# Patient Record
Sex: Female | Born: 1975 | Race: White | Hispanic: No | Marital: Married | Smoking: Never smoker
Health system: Southern US, Community
[De-identification: ages and names within clinical notes are randomized; demographics above are authoritative.]

## PROBLEM LIST (undated history)

## (undated) HISTORY — PX: BREAST LUMPECTOMY: SHX2

---

## 2013-03-23 ENCOUNTER — Inpatient Hospital Stay: Payer: Self-pay | Admitting: Obstetrics & Gynecology

## 2013-03-23 LAB — CBC WITH DIFFERENTIAL/PLATELET
Basophil #: 0.1 10*3/uL (ref 0.0–0.1)
Basophil %: 0.3 %
HGB: 12.4 g/dL (ref 12.0–16.0)
Lymphocyte %: 6.5 %
MCHC: 33.6 g/dL (ref 32.0–36.0)
MCV: 94 fL (ref 80–100)
Monocyte %: 4.4 %
RDW: 13.7 % (ref 11.5–14.5)

## 2013-03-24 LAB — HEMATOCRIT: HCT: 26.1 % — ABNORMAL LOW (ref 35.0–47.0)

## 2013-03-25 LAB — CBC WITH DIFFERENTIAL/PLATELET
Basophil #: 0 10*3/uL (ref 0.0–0.1)
Eosinophil #: 0.2 10*3/uL (ref 0.0–0.7)
Eosinophil %: 1.4 %
Lymphocyte #: 1.8 10*3/uL (ref 1.0–3.6)
Lymphocyte %: 13.6 %
MCHC: 33.6 g/dL (ref 32.0–36.0)
MCV: 94 fL (ref 80–100)
Monocyte %: 7 %
Neutrophil #: 10.2 10*3/uL — ABNORMAL HIGH (ref 1.4–6.5)
Neutrophil %: 77.7 %
Platelet: 216 10*3/uL (ref 150–440)
RBC: 2.7 10*6/uL — ABNORMAL LOW (ref 3.80–5.20)
RDW: 14.4 % (ref 11.5–14.5)
WBC: 13.1 10*3/uL — ABNORMAL HIGH (ref 3.6–11.0)

## 2013-05-18 ENCOUNTER — Ambulatory Visit: Payer: Self-pay | Admitting: Pediatrics

## 2013-05-29 ENCOUNTER — Ambulatory Visit: Payer: Self-pay | Admitting: Pediatrics

## 2015-04-05 NOTE — Op Note (Signed)
PATIENT NAME:  Kristin Black, Kristin Black MR#:  [id number] DATE OF BIRTH:  1976/04/24  DATE OF PROCEDURE:  03/23/2013  PREOPERATIVE DIAGNOSIS: Fetal intolerance to labor.   POSTOPERATIVE DIAGNOSIS: Fetal intolerance to labor.  PROCEDURE:  Low transverse cesarean section.   SURGEON: Senaida LangeLashawn Weaver Lee, M.D.   ASSISTANDierdre Searles: R. Paul Harris, MD   ANESTHESIA:  Epidural.  ESTIMATED BLOOD LOSS: 500 mL.  OPERATIVE FLUIDS: 1 liter.   COMPLICATIONS: None.   FINDINGS: Vertex female infant, weight 2940 grams, Apgars 9 and 9, normal uterus, tubes, and ovaries.   SPECIMEN TO PATHOLOGY: Placenta.   INDICATIONS: The patient is a 39 year old G1 who presents for labor.  However, during the course of her labor the baby had some decelerations in its fetal heart rate and therefore it was decided to proceed towards cesarean section for delivery. The risks, benefits, indications and alternatives of the procedure were explained and informed consent was obtained.   DESCRIPTION OF PROCEDURE: The patient was taken to the operating room with IV fluids running. She was prepped and draped in the usual sterile fashion with a leftward tilt. A Pfannenstiel skin incision was made, carried down to underlying fascia with a knife. The fascia was nicked in the midline. The incision was extended laterally. The superior aspect of the fascia was grasped with Kochers and the underlying rectus muscles were dissected off. This was repeated on the inferior fascia. The rectus muscles were divided in the midline, the peritoneum was entered bluntly and the opening was extended. The bladder blade was placed. The vesicouterine peritoneum was grasped with pick-ups and entered sharply with the Metzenbaum. Bladder flap was created digitally. The bladder blade was replaced. The hysterotomy incision was made and carried down to underlying fetal parts. The opening was extended. The infant's head was grasped and delivered atraumatically through the hysterotomy  incision. A nuchal cord x 1 was reduced. The mouth and nose were bulb suctioned. The anterior and posterior shoulder were delivered followed by the remainder of the body and the baby was handed to the awaiting neonatologist. The cord was clamped x 2 and cut, after collection of cord blood. The placenta was expressed. The uterus was exteriorized and cleared of all clot and debris. The hysterotomy incision was repaired with a #0 Monocryl in a running locked fashion. The uterus was returned to the abdomen. The abdomen and gutters were irrigated with copious amounts of warm normal saline. The peritoneum was closed with #2-0 Vicryl. The On-Q pump apparatus was placed, according to manufacturer's instructions. The fascia was closed with a #1 PDS. The skin was closed with #4-0 Vicryl. The catheters of the On-Q pump was bolused with 5 mL of 0.5% Sensorcaine plain each.  The catheters were secured to the abdomen using Steri-Strips and Tegaderm. The patient tolerated the procedure well. Sponge, lap, needle and instrument counts were correct x 2. The patient was awakened and taken to the recovery room in stable condition. ____________________________ Sonda PrimesLashawn A. Patton SallesWeaver-Lee, MD law:sb D: 04/04/2013 08:57:40 ET T: 04/04/2013 09:19:48 ET JOB#: [id number]  cc: Flint MelterLashawn A. Patton SallesWeaver-Lee, MD, <Dictator> Sonda PrimesLASHAWN A WEAVER LEE MD ELECTRONICALLY SIGNED 04/07/2013 17:12

## 2015-04-05 NOTE — Consult Note (Signed)
Details:   - Assesment: mother and infant canme in for a consult today. Mother is complaining of sore nipples and has been treated for yeast with Diflucan.Her pain now is described as feeling like a bruise and irritation on the nipple. She reports that any clothing on the nipple makes it feel tender.  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 In addition the baby ha had poor weight gain. Plan: Observed the feeding oobtaining a pre and post weight. Baby is using chomping motions to maintain the lips in a seal to geet milk. The post weight at 25 minutes was 22 ml.  I have reccommended that she refer back to her baby's pediatrician to reccommend treatment of the tongue tie.  In addition I told her to supplement the breast feeding with 1 to 2 ounces of her pumped milk. She may also pump if breatdfeeding is to painful.   Electronic Signatures: Lyla GlassingJuettner, Carolyn (RN)  (Signed 05-Jun-14 16:57)  Authored: Details   Last Updated: 05-Jun-14 16:57 by Lyla GlassingJuettner, Carolyn (RN)

## 2019-09-05 ENCOUNTER — Other Ambulatory Visit: Payer: Self-pay | Admitting: *Deleted

## 2019-09-05 DIAGNOSIS — Z20822 Contact with and (suspected) exposure to covid-19: Secondary | ICD-10-CM

## 2019-09-06 LAB — NOVEL CORONAVIRUS, NAA: SARS-CoV-2, NAA: NOT DETECTED

## 2019-10-04 ENCOUNTER — Other Ambulatory Visit: Payer: Self-pay

## 2019-10-04 ENCOUNTER — Ambulatory Visit: Payer: Self-pay

## 2019-10-04 DIAGNOSIS — Z23 Encounter for immunization: Secondary | ICD-10-CM

## 2019-12-11 ENCOUNTER — Ambulatory Visit: Payer: BC Managed Care – PPO | Attending: Internal Medicine

## 2019-12-11 DIAGNOSIS — Z20822 Contact with and (suspected) exposure to covid-19: Secondary | ICD-10-CM

## 2019-12-13 LAB — NOVEL CORONAVIRUS, NAA: SARS-CoV-2, NAA: NOT DETECTED

## 2021-10-08 ENCOUNTER — Other Ambulatory Visit: Payer: Self-pay

## 2021-10-08 ENCOUNTER — Ambulatory Visit: Payer: Self-pay

## 2021-10-08 DIAGNOSIS — Z23 Encounter for immunization: Secondary | ICD-10-CM

## 2022-05-01 ENCOUNTER — Other Ambulatory Visit: Payer: Self-pay | Admitting: Obstetrics & Gynecology

## 2022-05-01 DIAGNOSIS — Z1231 Encounter for screening mammogram for malignant neoplasm of breast: Secondary | ICD-10-CM

## 2022-05-29 ENCOUNTER — Ambulatory Visit
Admission: RE | Admit: 2022-05-29 | Discharge: 2022-05-29 | Disposition: A | Discharge status: Home / Self Care | Payer: BC Managed Care – PPO | Source: Ambulatory Visit | Attending: Obstetrics & Gynecology | Admitting: Obstetrics & Gynecology

## 2022-05-29 DIAGNOSIS — Z1231 Encounter for screening mammogram for malignant neoplasm of breast: Secondary | ICD-10-CM | POA: Insufficient documentation

## 2022-06-02 ENCOUNTER — Inpatient Hospital Stay
Admission: RE | Admit: 2022-06-02 | Discharge: 2022-06-02 | Disposition: A | Discharge status: Home / Self Care | Payer: Self-pay | Source: Ambulatory Visit | Attending: *Deleted | Admitting: *Deleted

## 2022-06-02 ENCOUNTER — Other Ambulatory Visit: Payer: Self-pay | Admitting: *Deleted

## 2022-06-02 DIAGNOSIS — Z1231 Encounter for screening mammogram for malignant neoplasm of breast: Secondary | ICD-10-CM

## 2023-06-07 ENCOUNTER — Other Ambulatory Visit: Payer: Self-pay | Admitting: Obstetrics & Gynecology

## 2023-06-07 DIAGNOSIS — Z1231 Encounter for screening mammogram for malignant neoplasm of breast: Secondary | ICD-10-CM

## 2023-07-07 ENCOUNTER — Ambulatory Visit
Admission: RE | Admit: 2023-07-07 | Discharge: 2023-07-07 | Disposition: A | Discharge status: Home / Self Care | Payer: BC Managed Care – PPO | Source: Ambulatory Visit | Attending: Obstetrics & Gynecology | Admitting: Obstetrics & Gynecology

## 2023-07-07 DIAGNOSIS — Z1231 Encounter for screening mammogram for malignant neoplasm of breast: Secondary | ICD-10-CM | POA: Diagnosis present

## 2023-07-15 ENCOUNTER — Other Ambulatory Visit: Payer: Self-pay | Admitting: Obstetrics & Gynecology

## 2023-07-15 DIAGNOSIS — R928 Other abnormal and inconclusive findings on diagnostic imaging of breast: Secondary | ICD-10-CM

## 2023-07-15 DIAGNOSIS — C801 Malignant (primary) neoplasm, unspecified: Secondary | ICD-10-CM

## 2023-07-15 HISTORY — DX: Malignant (primary) neoplasm, unspecified: C80.1

## 2023-07-16 ENCOUNTER — Other Ambulatory Visit: Payer: Self-pay | Admitting: Obstetrics & Gynecology

## 2023-07-16 ENCOUNTER — Encounter: Payer: Self-pay | Admitting: Obstetrics & Gynecology

## 2023-07-16 ENCOUNTER — Ambulatory Visit
Admission: RE | Admit: 2023-07-16 | Discharge: 2023-07-16 | Disposition: A | Discharge status: Home / Self Care | Payer: BC Managed Care – PPO | Source: Ambulatory Visit | Attending: Obstetrics & Gynecology | Admitting: Obstetrics & Gynecology

## 2023-07-16 DIAGNOSIS — R928 Other abnormal and inconclusive findings on diagnostic imaging of breast: Secondary | ICD-10-CM

## 2023-07-16 DIAGNOSIS — R921 Mammographic calcification found on diagnostic imaging of breast: Secondary | ICD-10-CM

## 2023-07-28 ENCOUNTER — Ambulatory Visit
Admission: RE | Admit: 2023-07-28 | Discharge: 2023-07-28 | Disposition: A | Discharge status: Home / Self Care | Payer: BC Managed Care – PPO | Source: Ambulatory Visit | Attending: Obstetrics & Gynecology | Admitting: Obstetrics & Gynecology

## 2023-07-28 DIAGNOSIS — R928 Other abnormal and inconclusive findings on diagnostic imaging of breast: Secondary | ICD-10-CM | POA: Insufficient documentation

## 2023-07-28 DIAGNOSIS — R921 Mammographic calcification found on diagnostic imaging of breast: Secondary | ICD-10-CM | POA: Insufficient documentation

## 2023-07-28 HISTORY — PX: BREAST BIOPSY: SHX20

## 2023-07-28 MED ORDER — LIDOCAINE HCL 1 % IJ SOLN
10.0000 mL | Freq: Once | INTRAMUSCULAR | Status: AC
  Administered 2023-07-28: 10 mL
  Filled 2023-07-28: qty 10

## 2023-07-28 MED ORDER — LIDOCAINE 1 % OPTIME INJ - NO CHARGE
5.0000 mL | Freq: Once | INTRAMUSCULAR | Status: AC
  Administered 2023-07-28: 5 mL
  Filled 2023-07-28: qty 6

## 2023-07-28 MED ORDER — LIDOCAINE-EPINEPHRINE 1 %-1:100000 IJ SOLN
10.0000 mL | Freq: Once | INTRAMUSCULAR | Status: AC
  Administered 2023-07-28: 10 mL
  Filled 2023-07-28: qty 10

## 2023-07-29 ENCOUNTER — Encounter: Payer: Self-pay | Admitting: *Deleted

## 2023-07-29 NOTE — Progress Notes (Signed)
Received referral for newly diagnosed breast cancer from [facility].  Navigation initiated.  She would like to discuss her results with her GYN and get her input on who she recommends for surgeon/med onc.   I will call her on Monday to see what her decision is.

## 2023-08-02 ENCOUNTER — Encounter: Payer: Self-pay | Admitting: *Deleted

## 2023-08-02 NOTE — Progress Notes (Signed)
Ms. Hritz has been referred to surgeon Dr. Cherlyn Cushing by her gyn.  No further needs at this time.

## 2023-12-30 ENCOUNTER — Encounter: Payer: Self-pay | Admitting: Gastroenterology

## 2024-01-03 ENCOUNTER — Encounter: Payer: Self-pay | Admitting: Gastroenterology

## 2024-01-13 ENCOUNTER — Ambulatory Visit (INDEPENDENT_AMBULATORY_CARE_PROVIDER_SITE_OTHER): Payer: Self-pay

## 2024-01-13 DIAGNOSIS — Z23 Encounter for immunization: Secondary | ICD-10-CM

## 2024-02-29 ENCOUNTER — Encounter: Payer: Self-pay | Admitting: Gastroenterology

## 2024-03-06 ENCOUNTER — Encounter: Payer: Self-pay | Admitting: Gastroenterology

## 2024-03-10 ENCOUNTER — Encounter: Payer: Self-pay | Admitting: Gastroenterology

## 2024-03-13 ENCOUNTER — Encounter
Admission: RE | Disposition: A | Discharge status: Home / Self Care | Payer: Self-pay | Source: Home / Self Care | Attending: Gastroenterology

## 2024-03-13 ENCOUNTER — Ambulatory Visit: Admitting: Anesthesiology

## 2024-03-13 ENCOUNTER — Other Ambulatory Visit: Payer: Self-pay | Admitting: Gastroenterology

## 2024-03-13 ENCOUNTER — Ambulatory Visit
Admission: RE | Admit: 2024-03-13 | Discharge: 2024-03-13 | Disposition: A | Discharge status: Home / Self Care | Payer: BC Managed Care – PPO | Attending: Gastroenterology | Admitting: Gastroenterology

## 2024-03-13 ENCOUNTER — Encounter: Payer: Self-pay | Admitting: Gastroenterology

## 2024-03-13 DIAGNOSIS — K2970 Gastritis, unspecified, without bleeding: Secondary | ICD-10-CM | POA: Insufficient documentation

## 2024-03-13 DIAGNOSIS — Z8371 Family history of adenomatous and serrated polyps: Secondary | ICD-10-CM | POA: Diagnosis not present

## 2024-03-13 DIAGNOSIS — D122 Benign neoplasm of ascending colon: Secondary | ICD-10-CM | POA: Insufficient documentation

## 2024-03-13 DIAGNOSIS — Z800 Family history of malignant neoplasm of digestive organs: Secondary | ICD-10-CM | POA: Insufficient documentation

## 2024-03-13 DIAGNOSIS — Z923 Personal history of irradiation: Secondary | ICD-10-CM | POA: Diagnosis not present

## 2024-03-13 DIAGNOSIS — Z9221 Personal history of antineoplastic chemotherapy: Secondary | ICD-10-CM | POA: Insufficient documentation

## 2024-03-13 DIAGNOSIS — Z1211 Encounter for screening for malignant neoplasm of colon: Secondary | ICD-10-CM | POA: Diagnosis present

## 2024-03-13 DIAGNOSIS — Z853 Personal history of malignant neoplasm of breast: Secondary | ICD-10-CM | POA: Insufficient documentation

## 2024-03-13 DIAGNOSIS — K621 Rectal polyp: Secondary | ICD-10-CM | POA: Insufficient documentation

## 2024-03-13 HISTORY — PX: POLYPECTOMY: SHX5525

## 2024-03-13 HISTORY — PX: COLONOSCOPY WITH PROPOFOL: SHX5780

## 2024-03-13 HISTORY — PX: ESOPHAGOGASTRODUODENOSCOPY (EGD) WITH PROPOFOL: SHX5813

## 2024-03-13 LAB — POCT PREGNANCY, URINE: Preg Test, Ur: NEGATIVE

## 2024-03-13 SURGERY — COLONOSCOPY WITH PROPOFOL
Anesthesia: General

## 2024-03-13 MED ORDER — PROPOFOL 500 MG/50ML IV EMUL
INTRAVENOUS | Status: DC | PRN
  Administered 2024-03-13: 160 ug/kg/min via INTRAVENOUS

## 2024-03-13 MED ORDER — LIDOCAINE HCL (PF) 2 % IJ SOLN
INTRAMUSCULAR | Status: AC
  Filled 2024-03-13: qty 5

## 2024-03-13 MED ORDER — SODIUM CHLORIDE 0.9 % IV SOLN
INTRAVENOUS | Status: DC
  Administered 2024-03-13: 1000 mL via INTRAVENOUS

## 2024-03-13 MED ORDER — PROPOFOL 10 MG/ML IV BOLUS
INTRAVENOUS | Status: DC | PRN
  Administered 2024-03-13: 50 mg via INTRAVENOUS

## 2024-03-13 MED ORDER — LIDOCAINE HCL (CARDIAC) PF 100 MG/5ML IV SOSY
PREFILLED_SYRINGE | INTRAVENOUS | Status: DC | PRN
  Administered 2024-03-13: 50 mg via INTRAVENOUS

## 2024-03-13 MED ORDER — SODIUM CHLORIDE 0.9 % IV SOLN: INTRAVENOUS | Status: DC | PRN

## 2024-03-13 MED ORDER — DEXMEDETOMIDINE HCL IN NACL 80 MCG/20ML IV SOLN
INTRAVENOUS | Status: AC
  Filled 2024-03-13: qty 20

## 2024-03-13 MED ORDER — GLYCOPYRROLATE 0.2 MG/ML IJ SOLN
INTRAMUSCULAR | Status: AC
Start: 2024-03-13 — End: ?
  Filled 2024-03-13: qty 1

## 2024-03-13 MED ORDER — PROPOFOL 1000 MG/100ML IV EMUL
INTRAVENOUS | Status: AC
  Filled 2024-03-13: qty 100

## 2024-03-13 MED ORDER — GLYCOPYRROLATE 0.2 MG/ML IJ SOLN
INTRAMUSCULAR | Status: DC | PRN
  Administered 2024-03-13: .2 mg via INTRAVENOUS

## 2024-03-13 NOTE — Transfer of Care (Signed)
 Immediate Anesthesia Transfer of Care Note  Patient: Kristin Black  Procedure(s) Performed: COLONOSCOPY WITH PROPOFOL ESOPHAGOGASTRODUODENOSCOPY (EGD) WITH PROPOFOL  Patient Location: PACU  Anesthesia Type:General  Level of Consciousness: sedated  Airway & Oxygen Therapy: Patient Spontanous Breathing  Post-op Assessment: Report given to RN and Post -op Vital signs reviewed and stable  Post vital signs: Reviewed and stable  Last Vitals:  Vitals Value Taken Time  BP 100/61 03/13/24 0814  Temp 36.1 C 03/13/24 0813  Pulse 66 03/13/24 0814  Resp 19 03/13/24 0814  SpO2 100 % 03/13/24 0814  Vitals shown include unfiled device data.  Last Pain:  Vitals:   03/13/24 0813  TempSrc: Temporal  PainSc: 0-No pain         Complications: No notable events documented.

## 2024-03-13 NOTE — Anesthesia Preprocedure Evaluation (Signed)
 Anesthesia Evaluation  Patient identified by MRN, date of birth, ID band Patient awake    Reviewed: Allergy & Precautions, NPO status , Patient's Chart, lab work & pertinent test results  History of Anesthesia Complications Negative for: history of anesthetic complications  Airway Mallampati: III  TM Distance: <3 FB Neck ROM: full    Dental  (+) Chipped   Pulmonary neg pulmonary ROS, neg shortness of breath   Pulmonary exam normal        Cardiovascular Exercise Tolerance: Good (-) angina negative cardio ROS Normal cardiovascular exam     Neuro/Psych negative neurological ROS  negative psych ROS   GI/Hepatic negative GI ROS, Neg liver ROS,neg GERD  ,,  Endo/Other  negative endocrine ROS    Renal/GU negative Renal ROS  negative genitourinary   Musculoskeletal   Abdominal   Peds  Hematology negative hematology ROS (+)   Anesthesia Other Findings Past Medical History: 07/2023: Cancer [facility])  Past Surgical History: 07/28/2023: BREAST BIOPSY; Left     Comment:  affirm bx, x marker, path pending 07/28/2023: BREAST BIOPSY; Left     Comment:  MM LT BREAST BX W LOC DEV 1ST LESION IMAGE BX SPEC               STEREO GUIDE 07/28/2023 [facility] No date: BREAST LUMPECTOMY; Left     Reproductive/Obstetrics negative OB ROS                             Anesthesia Physical Anesthesia Plan  ASA: 2  Anesthesia Plan: General   Post-op Pain Management:    Induction: Intravenous  PONV Risk Score and Plan: Propofol infusion and TIVA  Airway Management Planned: Natural Airway and Nasal Cannula  Additional Equipment:   Intra-op Plan:   Post-operative Plan:   Informed Consent: I have reviewed the patients History and Physical, chart, labs and discussed the procedure including the risks, benefits and alternatives for the proposed anesthesia with the patient or authorized representative  who has indicated his/her understanding and acceptance.     Dental Advisory Given  Plan Discussed with: Anesthesiologist, CRNA and Surgeon  Anesthesia Plan Comments: (Patient consented for risks of anesthesia including but not limited to:  - adverse reactions to medications - risk of airway placement if required - damage to eyes, teeth, lips or other oral mucosa - nerve damage due to positioning  - sore throat or hoarseness - Damage to heart, brain, nerves, lungs, other parts of body or loss of life  Patient voiced understanding and assent.)       Anesthesia Quick Evaluation

## 2024-03-13 NOTE — Interval H&P Note (Signed)
 History and Physical Interval Note: Preprocedure H&P from 03/13/24  was reviewed and there was no interval change after seeing and examining the patient.  Written consent was obtained from the patient after discussion of risks, benefits, and alternatives. Patient has consented to proceed with Esophagogastroduodenoscopy and Colonoscopy with possible intervention   03/13/2024 7:19 AM  Kristin Black  has presented today for surgery, with the diagnosis of D50.9 (ICD-10-CM) - Iron deficiency anemia, unspecified iron deficiency anemia type.  The various methods of treatment have been discussed with the patient and family. After consideration of risks, benefits and other options for treatment, the patient has consented to  Procedure(s): COLONOSCOPY WITH PROPOFOL (N/A) ESOPHAGOGASTRODUODENOSCOPY (EGD) WITH PROPOFOL (N/A) as a surgical intervention.  The patient's history has been reviewed, patient examined, no change in status, stable for surgery.  I have reviewed the patient's chart and labs.  Questions were answered to the patient's satisfaction.     Jaynie Collins

## 2024-03-13 NOTE — Op Note (Addendum)
 [facility] Patient Name: Kristin Black Procedure Date: 03/13/2024 7:36 AM MRN: [id number] Account #: [id number] Date of Birth: [birth year] Admit Type: Outpatient Age: 48 Room: [facility] ENDO ROOM 2 Gender: Female Note Status: Supervisor Override Instrument Name: Peds Colonoscope [id number] Procedure:             Colonoscopy Indications:           Colon cancer screening in patient with 1st-degree                         relative having advanced adenoma of the colon before                         age 45, Iron deficiency anemia Providers:             Trenda Moots, DO Referring MD:          Jaynie Collins DO, DO (Referring MD), No Local                         Md, MD (Referring MD) Medicines:             Monitored Anesthesia Care Complications:         No immediate complications. Estimated blood loss:                         Minimal. Procedure:             Pre-Anesthesia Assessment:                        - Prior to the procedure, a History and Physical was                         performed, and patient medications and allergies were                         reviewed. The patient is competent. The risks and                         benefits of the procedure and the sedation options and                         risks were discussed with the patient. All questions                         were answered and informed consent was obtained.                         Patient identification and proposed procedure were                         verified by the physician, the nurse, the anesthetist                         and the technician in the endoscopy suite. Mental                         Status Examination: alert and oriented. Airway  Examination: normal oropharyngeal airway and neck                         mobility. Respiratory Examination: clear to                         auscultation. CV Examination: RRR, no murmurs, no S3                          or S4. Prophylactic Antibiotics: The patient does not                         require prophylactic antibiotics. Prior                         Anticoagulants: The patient has taken no anticoagulant                         or antiplatelet agents. ASA Grade Assessment: II - A                         patient with mild systemic disease. After reviewing                         the risks and benefits, the patient was deemed in                         satisfactory condition to undergo the procedure. The                         anesthesia plan was to use monitored anesthesia care                         (MAC). Immediately prior to administration of                         medications, the patient was re-assessed for adequacy                         to receive sedatives. The heart rate, respiratory                         rate, oxygen saturations, blood pressure, adequacy of                         pulmonary ventilation, and response to care were                         monitored throughout the procedure. The physical                         status of the patient was re-assessed after the                         procedure.                        After obtaining informed consent, the colonoscope was  passed under direct vision. Throughout the procedure,                         the patient's blood pressure, pulse, and oxygen                         saturations were monitored continuously. The                         Colonoscope was introduced through the anus and                         advanced to the the terminal ileum, with                         identification of the appendiceal orifice and IC                         valve. The colonoscopy was performed without                         difficulty. The patient tolerated the procedure well.                         The quality of the bowel preparation was evaluated                         using the BBPS  [facility] Bowel Preparation Scale) with                         scores of: Right Colon = 3, Transverse Colon = 3 and                         Left Colon = 3 (entire mucosa seen well with no                         residual staining, small fragments of stool or opaque                         liquid). The total BBPS score equals 9. The terminal                         ileum, ileocecal valve, appendiceal orifice, and                         rectum were photographed. Findings:      The perianal and digital rectal examinations were normal. Pertinent       negatives include normal sphincter tone.      The terminal ileum appeared normal. Estimated blood loss: none.      Retroflexion in the right colon was performed.      Two sessile polyps were found in the rectum and ascending colon. The       polyps were 1 to 3 mm in size. These polyps were removed with a jumbo       cold forceps. Resection and retrieval were complete. Estimated blood       loss was minimal.      The exam was otherwise  without abnormality on direct and retroflexion       views. Impression:            - The examined portion of the ileum was normal.                        - Two 1 to 3 mm polyps in the rectum and in the                         ascending colon, removed with a jumbo cold forceps.                         Resected and retrieved.                        - The examination was otherwise normal on direct and                         retroflexion views. Recommendation:        - Patient has a contact number available for                         emergencies. The signs and symptoms of potential                         delayed complications were discussed with the patient.                         Return to normal activities tomorrow. Written                         discharge instructions were provided to the patient.                        - Discharge patient to home.                        - Resume previous diet.                         - Continue present medications.                        - Await pathology results.                        - Repeat colonoscopy in 5 years for surveillance based                         on pathology results.                        - Return to GI office as previously scheduled.                        - The findings and recommendations were discussed with                         the patient. Procedure Code(s):     --- Professional ---  60454, Colonoscopy, flexible; with biopsy, single or                         multiple Diagnosis Code(s):     --- Professional ---                        Z83.71, Family history of colonic polyps                        D12.8, Benign neoplasm of rectum                        D12.2, Benign neoplasm of ascending colon CPT copyright 2022 American Medical Association. All rights reserved. The codes documented in this report are preliminary and upon coder review may  be revised to meet current compliance requirements. Attending Participation:      I personally performed the entire procedure. Elfredia Nevins, DO Jaynie Collins DO, DO 03/13/2024 8:13:27 AM This report has been signed electronically. Number of Addenda: 0 Note Initiated On: 03/13/2024 7:36 AM Scope Withdrawal Time: 0 hours 9 minutes 59 seconds  Total Procedure Duration: 0 hours 15 minutes 32 seconds  Estimated Blood Loss:  Estimated blood loss was minimal.      [facility]

## 2024-03-13 NOTE — Op Note (Addendum)
 [facility] Gastroenterology Patient Name: Kristin Black Procedure Date: 03/13/2024 7:37 AM MRN: [id number] Account #: [id number] Date of Birth: [birth year] Admit Type: Outpatient Age: 48 Room: [facility] ENDO ROOM 2 Gender: Female Note Status: Finalized Instrument Name: Laurette Schimke [id number] Procedure:             Upper GI endoscopy Indications:           Iron deficiency anemia Providers:             Trenda Moots, DO Referring MD:          Jaynie Collins DO, DO (Referring MD), No Local                         Md, MD (Referring MD) Medicines:             Monitored Anesthesia Care Complications:         No immediate complications. Estimated blood loss:                         Minimal. Procedure:             Pre-Anesthesia Assessment:                        - Prior to the procedure, a History and Physical was                         performed, and patient medications and allergies were                         reviewed. The patient is competent. The risks and                         benefits of the procedure and the sedation options and                         risks were discussed with the patient. All questions                         were answered and informed consent was obtained.                         Patient identification and proposed procedure were                         verified by the physician, the nurse, the anesthetist                         and the technician in the endoscopy suite. Mental                         Status Examination: alert and oriented. Airway                         Examination: normal oropharyngeal airway and neck                         mobility. Respiratory Examination: clear to  auscultation. CV Examination: RRR, no murmurs, no S3                         or S4. Prophylactic Antibiotics: The patient does not                         require prophylactic antibiotics. Prior                          Anticoagulants: The patient has taken no anticoagulant                         or antiplatelet agents. ASA Grade Assessment: II - A                         patient with mild systemic disease. After reviewing                         the risks and benefits, the patient was deemed in                         satisfactory condition to undergo the procedure. The                         anesthesia plan was to use monitored anesthesia care                         (MAC). Immediately prior to administration of                         medications, the patient was re-assessed for adequacy                         to receive sedatives. The heart rate, respiratory                         rate, oxygen saturations, blood pressure, adequacy of                         pulmonary ventilation, and response to care were                         monitored throughout the procedure. The physical                         status of the patient was re-assessed after the                         procedure.                        After obtaining informed consent, the endoscope was                         passed under direct vision. Throughout the procedure,                         the patient's blood pressure, pulse, and oxygen  saturations were monitored continuously. The Endoscope                         was introduced through the mouth, and advanced to the                         second part of duodenum. The upper GI endoscopy was                         accomplished without difficulty. The patient tolerated                         the procedure well. Findings:      The duodenal bulb, first portion of the duodenum and second portion of       the duodenum were normal. Biopsies for histology were taken with a cold       forceps for evaluation of celiac disease. Estimated blood loss was       minimal.      Localized mild inflammation characterized by erosions was found in the       gastric antrum.  Biopsies were taken with a cold forceps for Helicobacter       pylori testing. Estimated blood loss was minimal.      The exam of the stomach was otherwise normal.      The Z-line was regular. Estimated blood loss: none.      Esophagogastric landmarks were identified: the gastroesophageal junction       was found at 40 cm from the incisors.      The exam of the esophagus was otherwise normal. Impression:            - Normal duodenal bulb, first portion of the duodenum                         and second portion of the duodenum. Biopsied.                        - Gastritis. Biopsied.                        - Z-line regular.                        - Esophagogastric landmarks identified. Recommendation:        - Patient has a contact number available for                         emergencies. The signs and symptoms of potential                         delayed complications were discussed with the patient.                         Return to normal activities tomorrow. Written                         discharge instructions were provided to the patient.                        - Resume previous diet.                        -  Continue present medications.                        - Await pathology results.                        - Return to GI clinic as previously scheduled.                        - proceed with colonoscopy. see report for further                         recommendations.                        - The findings and recommendations were discussed with                         the patient. Procedure Code(s):     --- Professional ---                        [phone], Esophagogastroduodenoscopy, flexible,                         transoral; with biopsy, single or multiple Diagnosis Code(s):     --- Professional ---                        K29.70, Gastritis, unspecified, without bleeding                        D50.9, Iron deficiency anemia, unspecified CPT copyright 2022 American Medical  Association. All rights reserved. The codes documented in this report are preliminary and upon coder review may  be revised to meet current compliance requirements. Attending Participation:      I personally performed the entire procedure. Elfredia Nevins, DO Jaynie Collins DO, DO 03/13/2024 7:50:54 AM This report has been signed electronically. Number of Addenda: 0 Note Initiated On: 03/13/2024 7:37 AM Estimated Blood Loss:  Estimated blood loss was minimal.      [facility]

## 2024-03-13 NOTE — Anesthesia Postprocedure Evaluation (Signed)
 Anesthesia Post Note  Patient: Kristin Black  Procedure(s) Performed: COLONOSCOPY WITH PROPOFOL ESOPHAGOGASTRODUODENOSCOPY (EGD) WITH PROPOFOL POLYPECTOMY  Patient location during evaluation: Endoscopy Anesthesia Type: General Level of consciousness: awake and alert Pain management: pain level controlled Vital Signs Assessment: post-procedure vital signs reviewed and stable Respiratory status: spontaneous breathing, nonlabored ventilation, respiratory function stable and patient connected to nasal cannula oxygen Cardiovascular status: blood pressure returned to baseline and stable Postop Assessment: no apparent nausea or vomiting Anesthetic complications: no   No notable events documented.   Last Vitals:  Vitals:   03/13/24 0813 03/13/24 0833  BP: 100/61 109/67  Pulse: 67   Resp: 14 16  Temp: (!) 36.1 C   SpO2: 100%     Last Pain:  Vitals:   03/13/24 0833  TempSrc:   PainSc: 0-No pain                 Cleda Mccreedy Niasia Lanphear

## 2024-03-13 NOTE — H&P (Signed)
 Pre-Procedure H&P   Patient ID: Kristin Black is a 48 y.o. female.  Gastroenterology Provider: Jaynie Collins, DO  Referring Provider: Tawni Pummel, PA PCP: Kandyce Rud, MD  Date: 03/13/2024  HPI Ms. Kristin Black is a 48 y.o. female who presents today for Esophagogastroduodenoscopy and Colonoscopy for Iron deficiency anemia .  Patient reports regular bowel movements without melena or hematochezia.  History of heavy menses.  H/o breast cancer s/p surgery, systemic chemotherapy (finished January), and radiation  Hemoglobin currently 12.2 MCV 94 plt 223K.  On B12 supplementation as B12 was 282 After iron supplementation iron sat 41% TIBC 284 ferritin 74 creatinine 0.6   Past Medical History:  Diagnosis Date   Cancer (HCC) 07/2023    Past Surgical History:  Procedure Laterality Date   BREAST BIOPSY Left 07/28/2023   affirm bx, x marker, path pending   BREAST BIOPSY Left 07/28/2023   MM LT BREAST BX W LOC DEV 1ST LESION IMAGE BX SPEC STEREO GUIDE 07/28/2023 [facility]   BREAST LUMPECTOMY Left     Family History Brother- advanc colon polyps at age 80, paternal grandfather colorectal cancer in his 41sed  No h/o GI disease or malignancy  Review of Systems  Constitutional:  Negative for activity change, appetite change, chills, diaphoresis, fatigue, fever and unexpected weight change.  HENT:  Negative for trouble swallowing and voice change.   Respiratory:  Negative for shortness of breath and wheezing.   Cardiovascular:  Negative for chest pain, palpitations and leg swelling.  Gastrointestinal:  Negative for abdominal distention, abdominal pain, anal bleeding, blood in stool, constipation, diarrhea, nausea, rectal pain and vomiting.  Musculoskeletal:  Negative for arthralgias and myalgias.  Skin:  Negative for color change and pallor.  Neurological:  Negative for dizziness, syncope and weakness.  Psychiatric/Behavioral:  Negative for confusion.    All other systems reviewed and are negative.    Medications No current facility-administered medications on file prior to encounter.   Current Outpatient Medications on File Prior to Encounter  Medication Sig Dispense Refill   trastuzumab 2 mg/kg in sodium chloride 0.9 % 250 mL Inject 2 mg/kg into the vein once.      Pertinent medications related to GI and procedure were reviewed by me with the patient prior to the procedure   Current Facility-Administered Medications:    0.9 %  sodium chloride infusion, , Intravenous, Continuous, Jaynie Collins, DO  sodium chloride         Allergies  Allergen Reactions   Cox-2 Inhibitors (Sulfonamide) Other (See Comments)   Allergies were reviewed by me prior to the procedure  Objective   Body mass index is 22.32 kg/m. Vitals:   03/13/24 0714  BP: 131/65  Pulse: 68  Resp: 16  Temp: 97.6 F (36.4 C)  SpO2: 100%  Weight: 57.2 kg  Height: 5\' 3"  (1.6 m)     Physical Exam Vitals and nursing note reviewed.  Constitutional:      General: She is not in acute distress.    Appearance: Normal appearance. She is not ill-appearing, toxic-appearing or diaphoretic.  HENT:     Head: Normocephalic and atraumatic.     Nose: Nose normal.     Mouth/Throat:     Mouth: Mucous membranes are moist.     Pharynx: Oropharynx is clear.  Eyes:     General: No scleral icterus.    Extraocular Movements: Extraocular movements intact.  Cardiovascular:     Rate and Rhythm: Normal rate and regular rhythm.  Heart sounds: Normal heart sounds. No murmur heard.    No friction rub. No gallop.  Pulmonary:     Effort: Pulmonary effort is normal. No respiratory distress.     Breath sounds: Normal breath sounds. No wheezing, rhonchi or rales.  Abdominal:     General: Bowel sounds are normal. There is no distension.     Palpations: Abdomen is soft.     Tenderness: There is no abdominal tenderness. There is no guarding or rebound.  Musculoskeletal:      Cervical back: Neck supple.     Right lower leg: No edema.     Left lower leg: No edema.  Skin:    General: Skin is warm and dry.     Coloration: Skin is not jaundiced or pale.  Neurological:     General: No focal deficit present.     Mental Status: She is alert and oriented to person, place, and time. Mental status is at baseline.  Psychiatric:        Mood and Affect: Mood normal.        Behavior: Behavior normal.        Thought Content: Thought content normal.        Judgment: Judgment normal.      Assessment:  Ms. Kristin Black is a 48 y.o. female  who presents today for Esophagogastroduodenoscopy and Colonoscopy for Iron deficiency anemia .  Plan:  Esophagogastroduodenoscopy and Colonoscopy with possible intervention today  Esophagogastroduodenoscopy and Colonoscopy with possible biopsy, control of bleeding, polypectomy, and interventions as necessary has been discussed with the patient/patient representative. Informed consent was obtained from the patient/patient representative after explaining the indication, nature, and risks of the procedure including but not limited to death, bleeding, perforation, missed neoplasm/lesions, cardiorespiratory compromise, and reaction to medications. Opportunity for questions was given and appropriate answers were provided. Patient/patient representative has verbalized understanding is amenable to undergoing the procedure.   Jaynie Collins, DO  [facility]  Portions of the record may have been created with voice recognition software. Occasional wrong-word or 'sound-a-like' substitutions may have occurred due to the inherent limitations of voice recognition software.  Read the chart carefully and recognize, using context, where substitutions may have occurred.

## 2024-03-14 ENCOUNTER — Encounter: Payer: Self-pay | Admitting: Gastroenterology

## 2024-03-14 LAB — SURGICAL PATHOLOGY

## 2024-04-18 IMAGING — MG MM DIGITAL SCREENING BILAT W/ TOMO AND CAD
8 series · 9 of 24 positions shown · non-contrast
Comparison: Previous exam(s).

CLINICAL DATA: Screening.

EXAM:
DIGITAL SCREENING BILATERAL MAMMOGRAM WITH TOMOSYNTHESIS AND CAD
TECHNIQUE: Bilateral screening digital craniocaudal and mediolateral oblique
mammograms were obtained. Bilateral screening digital breast
tomosynthesis was performed. The images were evaluated with
computer-aided detection.

[R MLO synth-2D]
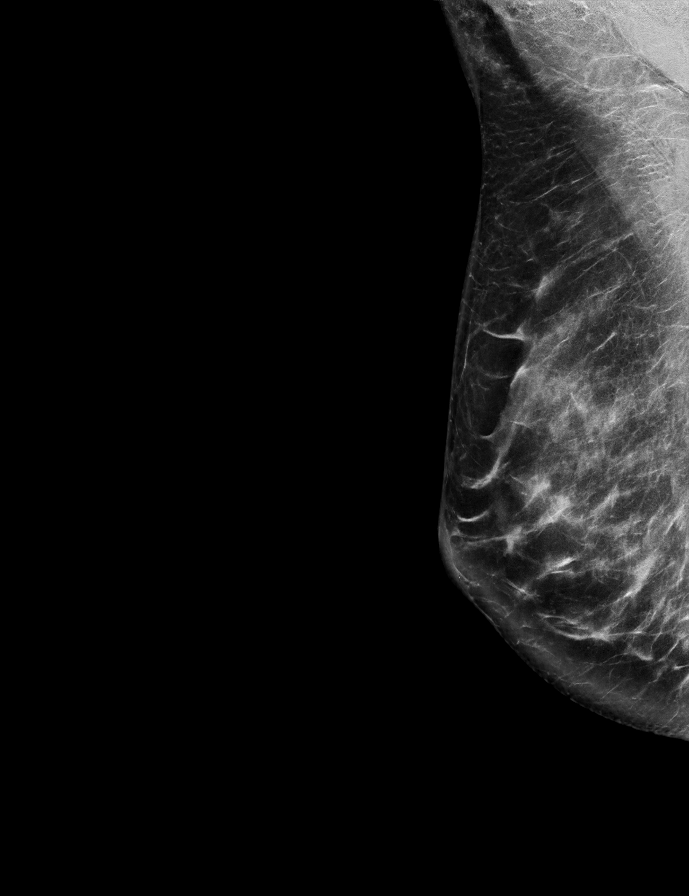

[L CC synth-2D]
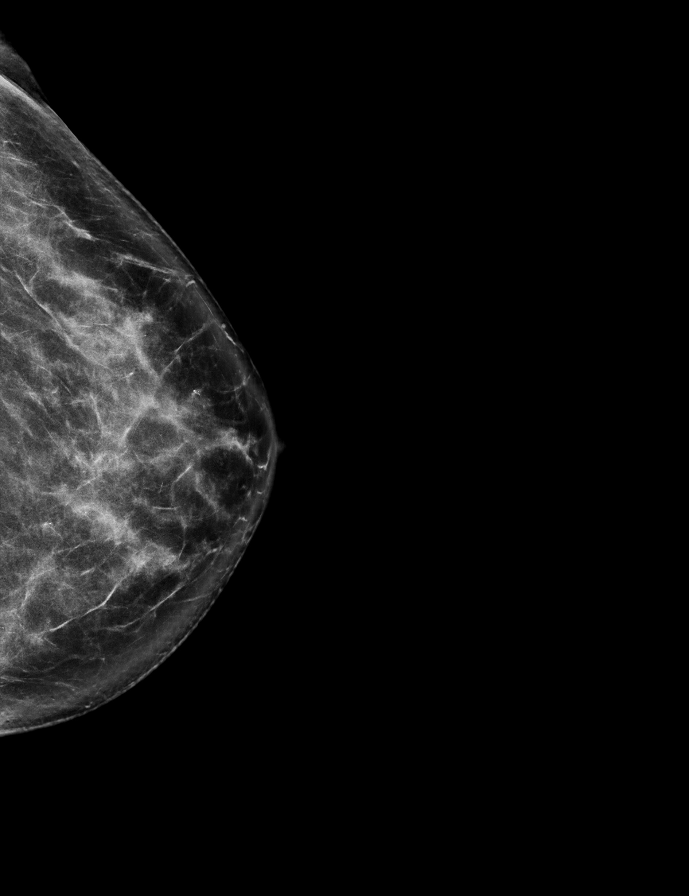

[R CC synth-2D]
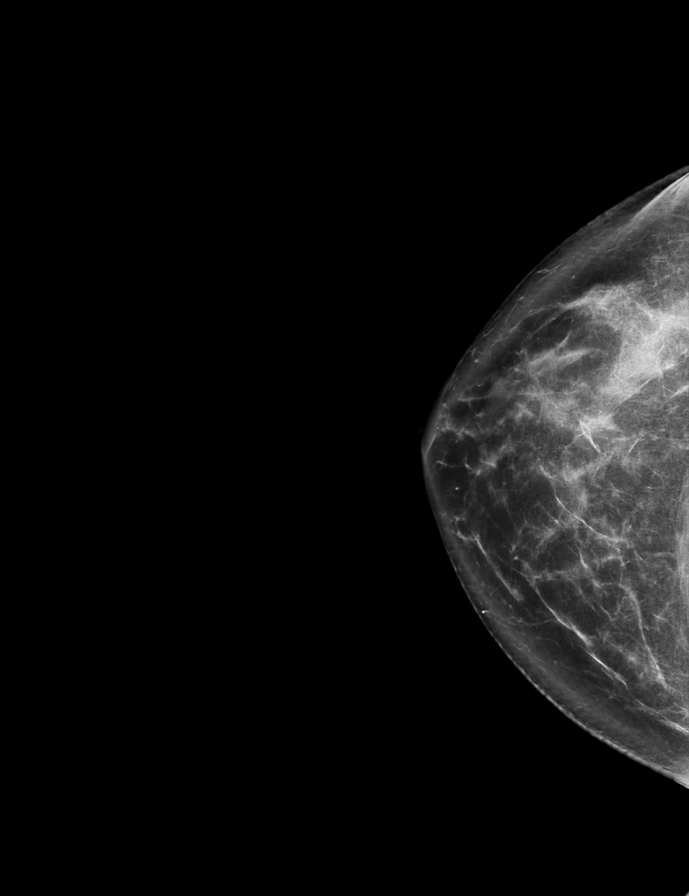

[L MLO synth-2D]
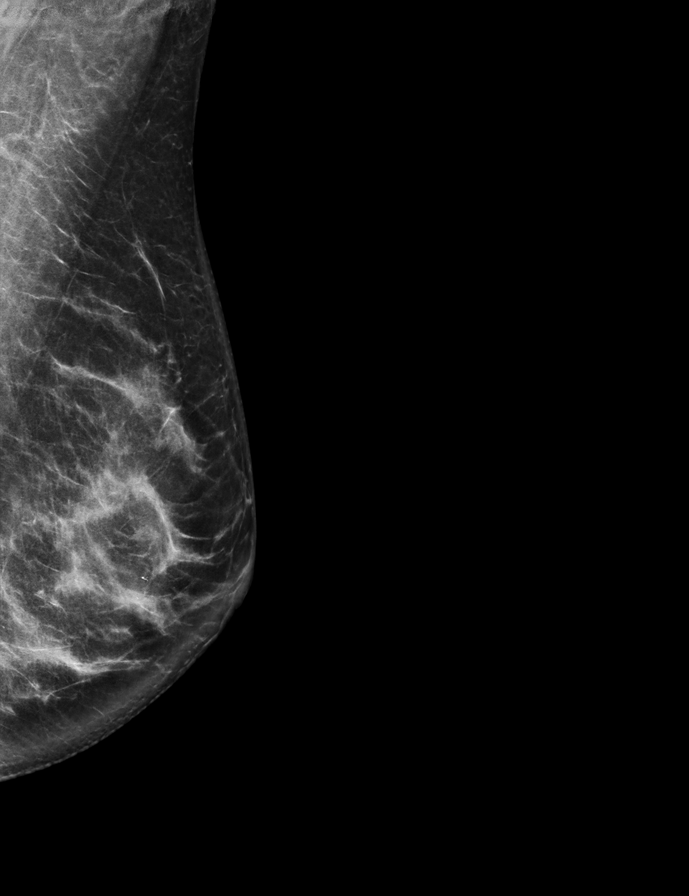

[R CC tomo · 2 of 75 frames shown]
[frame 25/75]
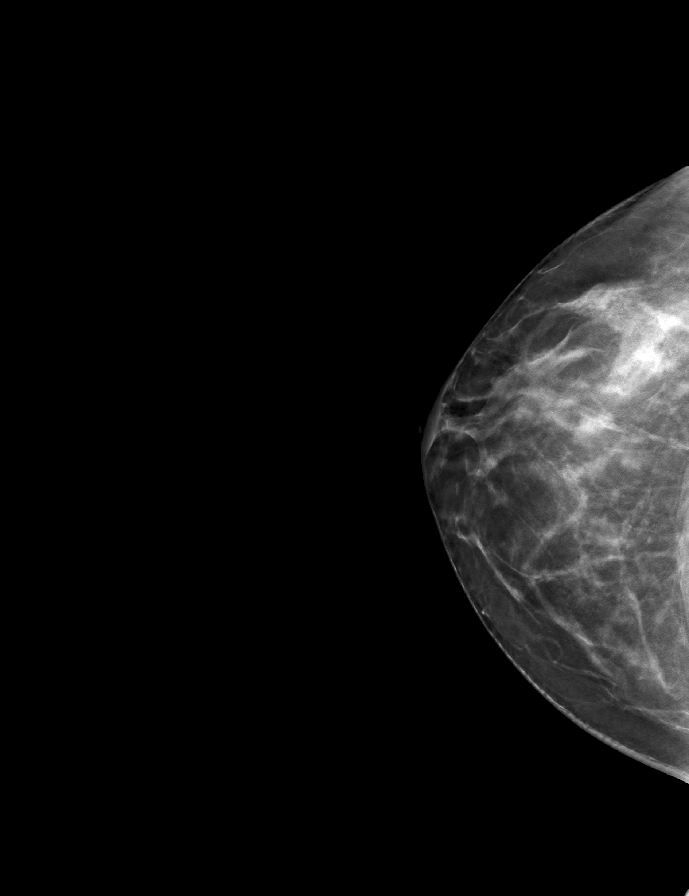
[frame 38/75]
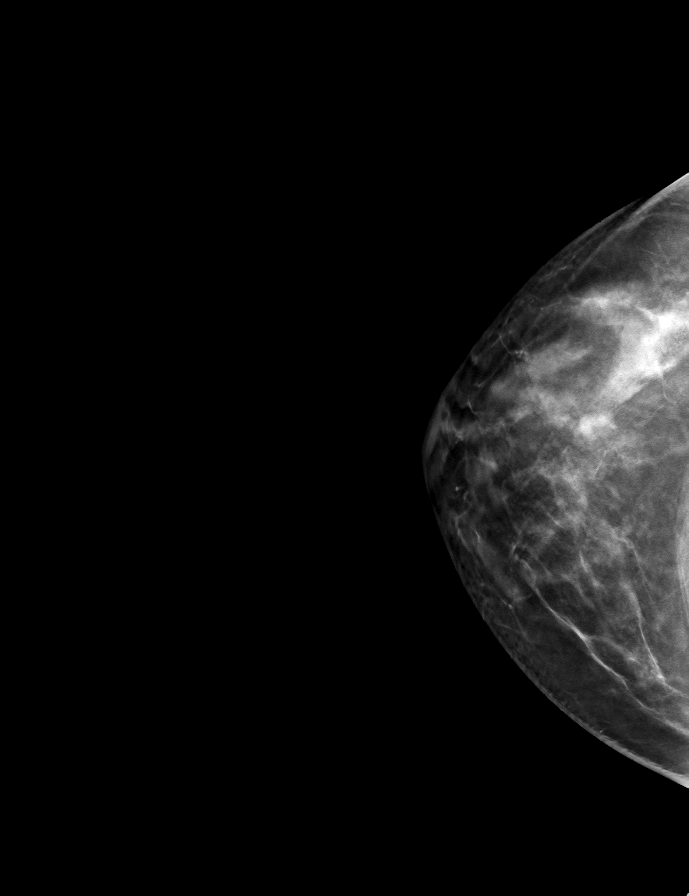

[L MLO tomo · tomo slice 37/72.0]
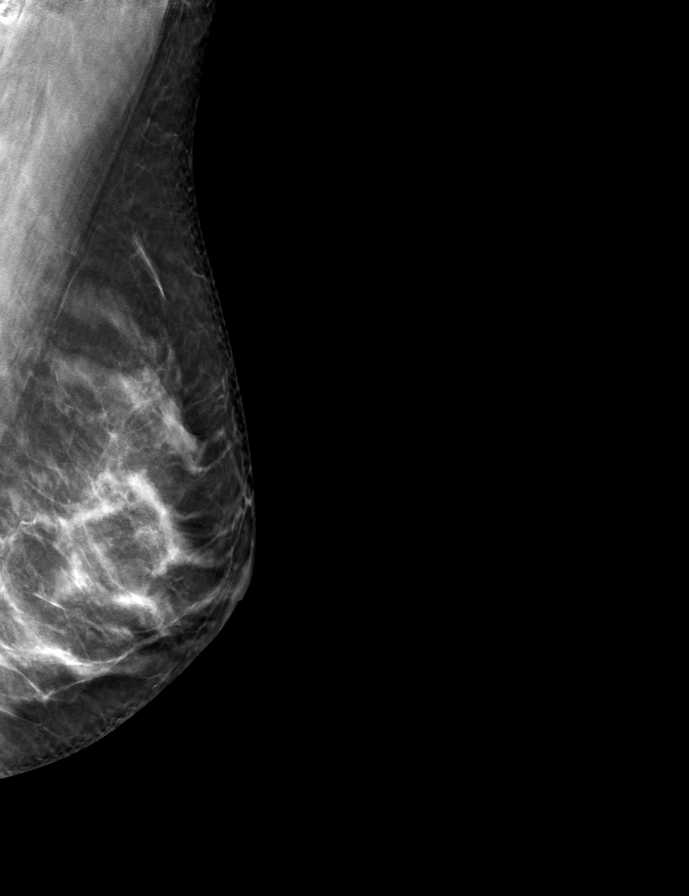

[R MLO tomo · tomo slice 40/79.0]
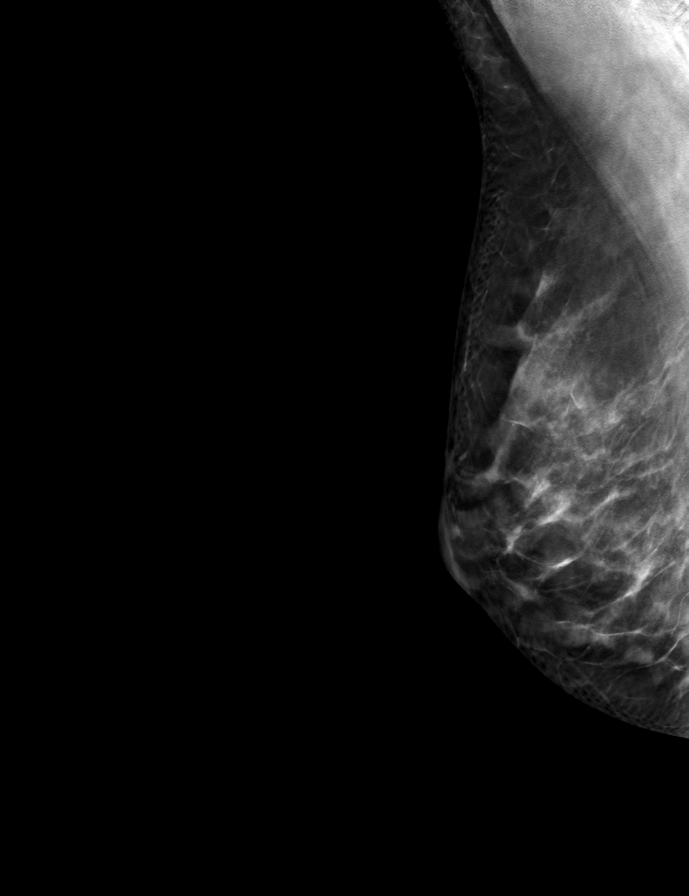

[L CC tomo · tomo slice 37/74.0]
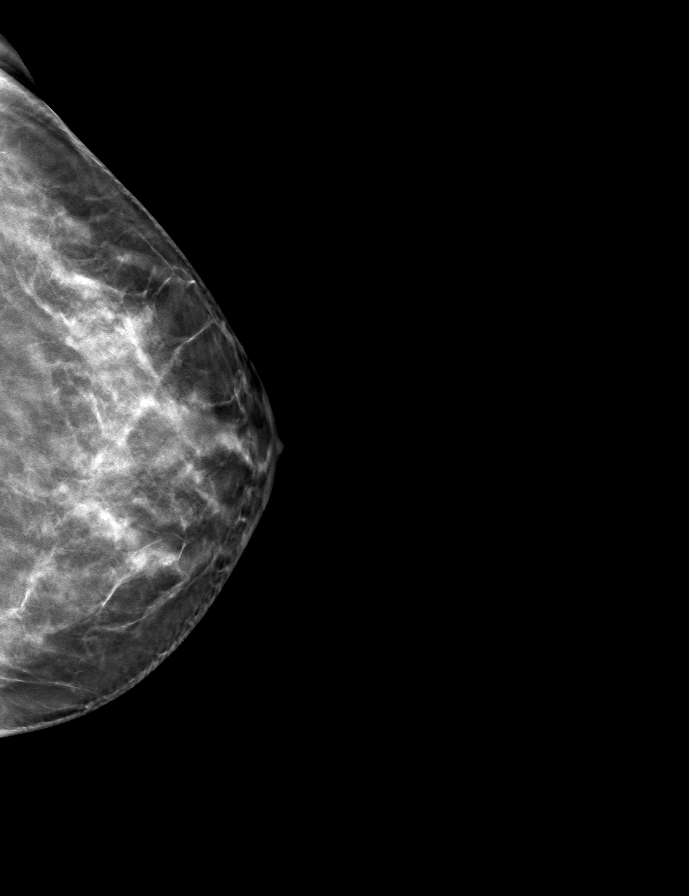

[9 of 24 positions shown; findings below may reference images not displayed]

ACR Breast Density Category c: The breast tissue is heterogeneously
dense, which may obscure small masses.
FINDINGS: There are no findings suspicious for malignancy.
IMPRESSION: No mammographic evidence of malignancy. A result letter of this
screening mammogram will be mailed directly to the patient.

RECOMMENDATION:
Screening mammogram in one year. (Code:Q3-W-BC3)

BI-RADS CATEGORY  1: Negative.

## 2024-08-16 ENCOUNTER — Other Ambulatory Visit: Payer: Self-pay | Admitting: Radiology

## 2024-08-16 DIAGNOSIS — Z853 Personal history of malignant neoplasm of breast: Secondary | ICD-10-CM

## 2024-08-22 ENCOUNTER — Inpatient Hospital Stay
Admission: RE | Admit: 2024-08-22 | Discharge: 2024-08-22 | Disposition: A | Discharge status: Home / Self Care | Payer: Self-pay | Source: Ambulatory Visit | Attending: Family Medicine | Admitting: Family Medicine

## 2024-08-22 ENCOUNTER — Other Ambulatory Visit: Payer: Self-pay | Admitting: *Deleted

## 2024-08-22 DIAGNOSIS — Z1231 Encounter for screening mammogram for malignant neoplasm of breast: Secondary | ICD-10-CM

## 2024-08-28 ENCOUNTER — Ambulatory Visit
Admission: RE | Admit: 2024-08-28 | Discharge: 2024-08-28 | Disposition: A | Discharge status: Home / Self Care | Source: Ambulatory Visit | Attending: Radiology | Admitting: Radiology

## 2024-08-28 DIAGNOSIS — Z853 Personal history of malignant neoplasm of breast: Secondary | ICD-10-CM

## 2024-08-29 ENCOUNTER — Encounter: Payer: Self-pay | Admitting: Radiology

## 2024-08-30 ENCOUNTER — Other Ambulatory Visit: Payer: Self-pay | Admitting: Oncology

## 2024-08-30 ENCOUNTER — Ambulatory Visit
Admission: RE | Admit: 2024-08-30 | Discharge: 2024-08-30 | Disposition: A | Discharge status: Home / Self Care | Source: Ambulatory Visit | Attending: Oncology | Admitting: Oncology

## 2024-08-30 DIAGNOSIS — R921 Mammographic calcification found on diagnostic imaging of breast: Secondary | ICD-10-CM

## 2024-08-30 DIAGNOSIS — R928 Other abnormal and inconclusive findings on diagnostic imaging of breast: Secondary | ICD-10-CM | POA: Diagnosis present

## 2024-08-30 DIAGNOSIS — N6022 Fibroadenosis of left breast: Secondary | ICD-10-CM | POA: Insufficient documentation

## 2024-08-30 HISTORY — PX: BREAST BIOPSY: SHX20

## 2024-08-30 MED ORDER — LIDOCAINE 1 % OPTIME INJ - NO CHARGE
5.0000 mL | Freq: Once | INTRAMUSCULAR | Status: AC
Start: 2024-08-30 — End: 2024-08-30
  Administered 2024-08-30: 5 mL
  Filled 2024-08-30: qty 6

## 2024-08-30 MED ORDER — LIDOCAINE-EPINEPHRINE 1 %-1:100000 IJ SOLN
20.0000 mL | Freq: Once | INTRAMUSCULAR | Status: AC
  Administered 2024-08-30: 20 mL
  Filled 2024-08-30: qty 20

## 2024-08-31 LAB — SURGICAL PATHOLOGY
